# Patient Record
Sex: Female | Born: 1985 | Race: White | Hispanic: No | Marital: Married | Smoking: Former smoker
Health system: Southern US, Community
[De-identification: ages and names within clinical notes are randomized; demographics above are authoritative.]

## PROBLEM LIST (undated history)

## (undated) DIAGNOSIS — E663 Overweight: Secondary | ICD-10-CM

## (undated) DIAGNOSIS — F419 Anxiety disorder, unspecified: Secondary | ICD-10-CM

## (undated) HISTORY — PX: TONSILLECTOMY: SUR1361

## (undated) HISTORY — DX: Overweight: E66.3

## (undated) HISTORY — DX: Anxiety disorder, unspecified: F41.9

---

## 2006-08-20 ENCOUNTER — Emergency Department: Payer: Self-pay | Admitting: General Practice
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
## 2007-07-15 ENCOUNTER — Ambulatory Visit: Payer: Self-pay

## 2008-09-08 ENCOUNTER — Ambulatory Visit: Payer: Self-pay

## 2011-02-28 ENCOUNTER — Ambulatory Visit: Payer: Self-pay | Admitting: Family Medicine

## 2012-05-11 ENCOUNTER — Ambulatory Visit: Payer: Self-pay | Admitting: Internal Medicine

## 2012-05-11 ENCOUNTER — Ambulatory Visit: Payer: Self-pay | Admitting: Physician Assistant

## 2012-05-11 LAB — CBC WITH DIFFERENTIAL/PLATELET
Basophil %: 0.2 %
Eosinophil %: 0.2 %
HCT: 41.1 % (ref 35.0–47.0)
Lymphocyte #: 1 10*3/uL (ref 1.0–3.6)
Lymphocyte %: 9.9 %
MCH: 31.3 pg (ref 26.0–34.0)
MCV: 94 fL (ref 80–100)
Monocyte #: 0.5 x10 3/mm (ref 0.2–0.9)
Neutrophil #: 8.6 10*3/uL — ABNORMAL HIGH (ref 1.4–6.5)
Platelet: 202 10*3/uL (ref 150–440)
RBC: 4.39 10*6/uL (ref 3.80–5.20)
RDW: 13.1 % (ref 11.5–14.5)

## 2012-05-11 LAB — URINALYSIS, COMPLETE
Blood: NEGATIVE
Glucose,UR: NEGATIVE mg/dL (ref 0–75)
Ph: 8 (ref 4.5–8.0)

## 2012-05-11 LAB — COMPREHENSIVE METABOLIC PANEL
Albumin: 4.3 g/dL (ref 3.4–5.0)
Anion Gap: 10 (ref 7–16)
BUN: 8 mg/dL (ref 7–18)
Bilirubin,Total: 0.3 mg/dL (ref 0.2–1.0)
Glucose: 88 mg/dL (ref 65–99)
SGPT (ALT): 21 U/L
Sodium: 141 mmol/L (ref 136–145)
Total Protein: 7.3 g/dL (ref 6.4–8.2)

## 2012-05-11 LAB — PREGNANCY, URINE: Pregnancy Test, Urine: NEGATIVE m[IU]/mL

## 2013-05-12 DIAGNOSIS — O42919 Preterm premature rupture of membranes, unspecified as to length of time between rupture and onset of labor, unspecified trimester: Secondary | ICD-10-CM

## 2013-05-12 DIAGNOSIS — O329XX0 Maternal care for malpresentation of fetus, unspecified, not applicable or unspecified: Secondary | ICD-10-CM

## 2014-01-05 ENCOUNTER — Ambulatory Visit: Payer: Self-pay | Admitting: Physician Assistant

## 2014-03-02 DIAGNOSIS — L309 Dermatitis, unspecified: Secondary | ICD-10-CM | POA: Insufficient documentation

## 2014-03-02 DIAGNOSIS — J309 Allergic rhinitis, unspecified: Secondary | ICD-10-CM | POA: Insufficient documentation

## 2014-12-28 ENCOUNTER — Ambulatory Visit: Payer: Self-pay | Admitting: Physician Assistant

## 2015-07-19 ENCOUNTER — Other Ambulatory Visit: Payer: Self-pay

## 2015-07-19 DIAGNOSIS — Z01419 Encounter for gynecological examination (general) (routine) without abnormal findings: Secondary | ICD-10-CM

## 2015-07-19 NOTE — Progress Notes (Signed)
Labs that were never drawn from annual exam 02/09/2015.

## 2015-09-15 ENCOUNTER — Encounter: Payer: Self-pay | Admitting: Obstetrics and Gynecology

## 2015-09-15 ENCOUNTER — Ambulatory Visit (INDEPENDENT_AMBULATORY_CARE_PROVIDER_SITE_OTHER): Payer: BLUE CROSS/BLUE SHIELD | Admitting: Obstetrics and Gynecology

## 2015-09-15 VITALS — BP 113/81 | HR 101 | Resp 14 | Ht 65.0 in | Wt 144.9 lb

## 2015-09-15 DIAGNOSIS — O34219 Maternal care for unspecified type scar from previous cesarean delivery: Secondary | ICD-10-CM

## 2015-09-15 DIAGNOSIS — O09211 Supervision of pregnancy with history of pre-term labor, first trimester: Secondary | ICD-10-CM | POA: Diagnosis not present

## 2015-09-15 DIAGNOSIS — N926 Irregular menstruation, unspecified: Secondary | ICD-10-CM | POA: Diagnosis not present

## 2015-09-15 DIAGNOSIS — O09219 Supervision of pregnancy with history of pre-term labor, unspecified trimester: Secondary | ICD-10-CM | POA: Insufficient documentation

## 2015-09-15 DIAGNOSIS — Z3200 Encounter for pregnancy test, result unknown: Secondary | ICD-10-CM

## 2015-09-15 LAB — POCT URINE PREGNANCY: Preg Test, Ur: POSITIVE — AB

## 2015-09-15 MED ORDER — DOXYLAMINE-PYRIDOXINE 10-10 MG PO TBEC: 10.0000 mg | DELAYED_RELEASE_TABLET | Freq: Every day | ORAL | Status: DC

## 2015-09-15 NOTE — Patient Instructions (Signed)

## 2015-09-15 NOTE — Progress Notes (Signed)
GYNECOLOGY CLINIC PROGRESS NOTE  Subjective:    Natalie Bennett is a 29 y.o. [id number] female who presents for evaluation of amenorrhea. She believes she could be pregnant. Pregnancy is desired. Sexual Activity: single partner, contraception: none. Current symptoms also include: morning sickness and positive home pregnancy test. Last period was normal.   Patient's last menstrual period was 08/06/2015.    History reviewed. No pertinent past medical history.  Past Surgical History  Procedure Laterality Date  . Cesarean section  2014    T-incision for tranvserse presentation at 34 weeks (PPROM)    Family History  Problem Relation Age of Onset  . Cancer Mother     osteo tumor  . Diabetes Father    Social History   Social History  . Marital Status: Married    Spouse Name: N/A  . Number of Children: N/A  . Years of Education: N/A   Occupational History  . Not on file.   Social History Main Topics  . Smoking status: Never Smoker   . Smokeless tobacco: Not on file  . Alcohol Use: Not on file  . Drug Use: Not on file  . Sexual Activity: Yes   Other Topics Concern  . Not on file   Social History Narrative  . No narrative on file    No current outpatient prescriptions on file prior to visit.   No current facility-administered medications on file prior to visit.    No Known Allergies   Review of Systems A comprehensive review of systems was negative except for: Gastrointestinal: positive for nausea     Objective:    BP 113/81 mmHg  Pulse 101  Resp 14  Ht 5\' 5"  (1.651 m)  Wt 144 lb 14.4 oz (65.726 kg)  BMI 24.11 kg/m2  LMP 08/06/2015 General: alert, no distress and no acute distress    Lab Review Urine HCG: positive    Assessment:    Absence of menstruation.   Nausea   Plan:   - Pregnancy Test: Positive: EDC: 05/13/2015, EGA at 5.5 weeks today by LMP. Briefly discussed pre-natal care options. Pregnancy, Childbirth and the Newborn book given.  Encouraged well-balanced diet, plenty of rest when needed, pre-natal vitamins daily and walking for exercise. Discussed self-help for nausea, avoiding OTC medications until consulting provider or pharmacist, other than Tylenol as needed, minimal caffeine (1-2 cups daily) and avoiding alcohol. She will schedule her initial OB visit in the next month. Feel free to call with any questions. - Patient reports that she is high risk due to h/o [facility] for PPROM with transverse presentation at 34 weeks). Understands that she will not be able to labor and will need to have repeat C-section. Also discussed that due to preterm labor (PPROM), would recommend 17-OHP injections beginning at [redacted] weeks gestation to prevent future preterm deliveries.  Patient notes understanding. - RTC in 1-2 weeks for viability scan and NOB intake.  - Will prescribe Diclegis for nausea of pregnancy.   A total of 15 minutes were spent face-to-face with the patient during this encounter and over half of that time dealt with counseling and coordination of care.  Hildred LaserAnika Jareli Highland, MD [facility]

## 2015-09-28 ENCOUNTER — Ambulatory Visit: Payer: BLUE CROSS/BLUE SHIELD

## 2015-09-28 ENCOUNTER — Ambulatory Visit (INDEPENDENT_AMBULATORY_CARE_PROVIDER_SITE_OTHER): Payer: BLUE CROSS/BLUE SHIELD | Admitting: Obstetrics and Gynecology

## 2015-09-28 VITALS — BP 111/73 | HR 94 | Wt 144.1 lb

## 2015-09-28 DIAGNOSIS — O30049 Twin pregnancy, dichorionic/diamniotic, unspecified trimester: Secondary | ICD-10-CM

## 2015-09-28 DIAGNOSIS — N926 Irregular menstruation, unspecified: Secondary | ICD-10-CM

## 2015-09-28 DIAGNOSIS — Z113 Encounter for screening for infections with a predominantly sexual mode of transmission: Secondary | ICD-10-CM

## 2015-09-28 NOTE — Progress Notes (Signed)
Cindi CarbonJennifer K Yetman presents for NOB nurse interview visit. G-2.  P-1001. Ultrasound done today indicates Mercie EonDi Di Twins. Baby A: 7.2 wks., Baby B: 7 wks.  EDD 05/12/2016. Pt also states she had Hep C booster in August-2016. Dr. Valentino Saxonherry aware and states this should not be a problem.  Pregnancy eduction material pt already had. No cats in the home. NOB labs ordered.  HIV labs and Drug screen were explained optional and she could opt out of tests but did not decline. Drug screen ordered and sent to lab. PNV encouraged. Declines genetic screening. Pt. To follow up with provider in 4 weeks for NOB physical.  To contact office if any concerns. All questions answered.  ZIKA EXPOSURE SCREEN:  The patient has not traveled to a [place] endemic area within the past 6 months, nor has she had unprotected sex with a partner who has travelled to a [place] endemic region within the past 6 months. The patient has been advised to notify us if these factors change any time during this current pregnancy, so adequate testing and monitoring can be initiated.

## 2015-09-28 NOTE — Patient Instructions (Signed)
Pregnancy and Zika Virus Disease Zika virus disease, or Zika, is an illness that can spread to people from mosquitoes that carry the virus. It may also spread from person to person through infected body fluids. Zika first occurred in [place], but recently it has spread to new areas. The virus occurs in tropical climates. The location of Zika continues to change. Most people who become infected with Zika virus do not develop serious illness. However, Zika may cause birth defects in an unborn baby whose mother is infected with the virus. It may also increase the risk of miscarriage. WHAT ARE THE SYMPTOMS OF ZIKA VIRUS DISEASE? In many cases, people who have been infected with Zika virus do not develop any symptoms. If symptoms appear, they usually start about a week after the person is infected. Symptoms are usually mild. They may include:  Fever.  Rash.  Red eyes.  Joint pain. HOW DOES ZIKA VIRUS DISEASE SPREAD? The main way that Zika virus spreads is through the bite of a certain type of mosquito. Unlike most types of mosquitos, which bite only at night, the type of mosquito that carries Zika virus bites both at night and during the day. Zika virus can also spread through sexual contact, through a blood transfusion, and from a mother to her baby before or during birth. Once you have had Zika virus disease, it is unlikely that you will get it again. CAN I PASS ZIKA TO MY BABY DURING PREGNANCY? Yes, Zika can pass from a mother to her baby before or during birth. WHAT PROBLEMS CAN ZIKA CAUSE FOR MY BABY? A woman who is infected with Zika virus while pregnant is at risk of having her baby born with a condition in which the brain or head is smaller than expected (microcephaly). Babies who have microcephaly can have developmental delays, seizures, hearing problems, and vision problems. Having Zika virus disease during pregnancy can also increase the risk of miscarriage. HOW CAN ZIKA VIRUS DISEASE BE  PREVENTED? There is no vaccine to prevent Zika. The best way to prevent the disease is to avoid infected mosquitoes and avoid exposure to body fluids that can spread the virus. Avoid any possible exposure to Zika by taking the following precautions. For women and their sex partners:  Avoid traveling to high-risk areas. The locations where Zika is being reported change often. To identify high-risk areas, check the CDC travel website: www.cdc.gov/zika/geo/index.html  If you or your sex partner must travel to a high-risk area, talk with a health care provider before and after traveling.  Take all precautions to avoid mosquito bites if you live in, or travel to, any of the high-risk areas. Insect repellents are safe to use during pregnancy.  Ask your health care provider when it is safe to have sexual contact. For women:  If you are pregnant or trying to become pregnant, avoid sexual contact with persons who may have been exposed to Zika virus, persons who have possible symptoms of Zika, or persons whose history you are unsure about. If you choose to have sexual contact with someone who may have been exposed to Zika virus, use condoms correctly during the entire duration of sexual activity, every time. Do not share sexual devices, as you may be exposed to body fluids.  Ask your health care provider about when it is safe to attempt pregnancy after a possible exposure to Zika virus. WHAT STEPS SHOULD I TAKE TO AVOID MOSQUITO BITES? Take these steps to avoid mosquito bites when you are   in a high-risk area:  Wear loose clothing that covers your arms and legs.  Limit your outdoor activities.  Do not open windows unless they have window screens.  Sleep under mosquito nets.  Use insect repellent. The best insect repellents have:  DEET, picaridin, oil of lemon eucalyptus (OLE), or IR3535 in them.  Higher amounts of an active ingredient in them.  Remember that insect repellents are safe to use  during pregnancy.  Do not use OLE on children who are younger than 3 years of age. Do not use insect repellent on babies who are younger than 2 months of age.  Cover your child's stroller with mosquito netting. Make sure the netting fits snugly and that any loose netting does not cover your child's mouth or nose. Do not use a blanket as a mosquito-protection cover.  Do not apply insect repellent underneath clothing.  If you are using sunscreen, apply the sunscreen before applying the insect repellent.  Treat clothing with permethrin. Do not apply permethrin directly to your skin. Follow label directions for safe use.  Get rid of standing water, where mosquitoes may reproduce. Standing water is often found in items such as buckets, bowls, animal food dishes, and flowerpots. When you return from traveling to any high-risk area, continue taking actions to protect yourself against mosquito bites for 3 weeks, even if you show no signs of illness. This will prevent spreading Zika virus to uninfected mosquitoes. WHAT SHOULD I KNOW ABOUT THE SEXUAL TRANSMISSION OF ZIKA? People can spread Zika to their sexual partners during vaginal, anal, or oral sex, or by sharing sexual devices. Many people with Zika do not develop symptoms, so a person could spread the disease without knowing that they are infected. The greatest risk is to women who are pregnant or who may become pregnant. Zika virus can live longer in semen than it can live in blood. Couples can prevent sexual transmission of the virus by:  Using condoms correctly during the entire duration of sexual activity, every time. This includes vaginal, anal, and oral sex.  Not sharing sexual devices. Sharing increases your risk of being exposed to body fluid from another person.  Avoiding all sexual activity until your health care provider says it is safe. SHOULD I BE TESTED FOR ZIKA VIRUS? A sample of your blood can be tested for Zika virus. A pregnant  woman should be tested if she may have been exposed to the virus or if she has symptoms of Zika. She may also have additional tests done during her pregnancy, such ultrasound testing. Talk with your health care provider about which tests are recommended.   This information is not intended to replace advice given to you by your health care provider. Make sure you discuss any questions you have with your health care provider.   Document Released: 07/27/2015 Document Reviewed: 07/20/2015 Elsevier Interactive Patient Education 2016 [employer]. Minor Illnesses and Medications in Pregnancy  Cold/Flu:  Sudafed for congestion- Robitussin (plain) for cough- Tylenol for discomfort.  Please follow the directions on the label.  Try not to take any more than needed.  OTC Saline nasal spray and air humidifier or cool-mist  Vaporizer to sooth nasal irritation and to loosen congestion.  It is also important to increase intake of non carbonated fluids, especially if you have a fever.  Constipation:  Colace-2 capsules at bedtime; Metamucil- follow directions on label; Senokot- 1 tablet at bedtime.  Any one of these medications can be used.  It is also   very important to increase fluids and fruits along with regular exercise.  If problem persists please call the office.  Diarrhea:  Kaopectate as directed on the label.  Eat a bland diet and increase fluids.  Avoid highly seasoned foods.  Headache:  Tylenol 1 or 2 tablets every 3-4 hours as needed  Indigestion:  Maalox, Mylanta, Tums or Rolaids- as directed on label.  Also try to eat small meals and avoid fatty, greasy or spicy foods.  Nausea with or without Vomiting:  Nausea in pregnancy is caused by increased levels of hormones in the body which influence the digestive system and cause irritation when stomach acids accumulate.  Symptoms usually subside after 1st trimester of pregnancy.  Try the following:  Keep saltines, graham crackers or dry toast by your bed  to eat upon awakening.  Don't let your stomach get empty.  Try to eat 5-6 small meals per day instead of 3 large ones.  Avoid greasy fatty or highly seasoned foods.   Take OTC Unisom 1 tablet at bed time along with OTC Vitamin B6 25-50 mg 3 times per day.    If nausea continues with vomiting and you are unable to keep down food and fluids you may need a prescription medication.  Please notify your provider.   Sore throat:  Chloraseptic spray, throat lozenges and or plain Tylenol.  Vaginal Yeast Infection:  OTC Monistat for 7 days as directed on label.  If symptoms do not resolve within a week notify provider.  If any of the above problems do not subside with recommended treatment please call the office for further assistance.   Do not take Aspirin, Advil, Motrin or Ibuprofen.  * * OTC= Over the counter

## 2015-09-28 NOTE — Progress Notes (Signed)
I have reviewed the record and concur with patient management and plan.  Patient with newly diagnosed di-di twin gestation.   Hildred LaserAnika Manan Olmo, MD [facility]

## 2015-09-29 LAB — PAIN MGT SCRN (14 DRUGS), UR
Amphetamine Screen, Ur: NEGATIVE ng/mL
BARBITURATE SCRN UR: NEGATIVE ng/mL
BENZODIAZEPINE SCREEN, URINE: NEGATIVE ng/mL
BUPRENORPHINE, URINE: NEGATIVE ng/mL
CANNABINOIDS UR QL SCN: NEGATIVE ng/mL
Cocaine(Metab.)Screen, Urine: NEGATIVE ng/mL
Creatinine(Crt), U: 54.1 mg/dL (ref 20.0–300.0)
Fentanyl, Urine: NEGATIVE pg/mL
MEPERIDINE SCREEN, URINE: NEGATIVE ng/mL
METHADONE SCREEN, URINE: NEGATIVE ng/mL
OXYCODONE+OXYMORPHONE UR QL SCN: NEGATIVE ng/mL
Opiate Scrn, Ur: NEGATIVE ng/mL
PCP SCRN UR: NEGATIVE ng/mL
PH UR, DRUG SCRN: 6.4 (ref 4.5–8.9)
Propoxyphene, Screen: NEGATIVE ng/mL
Tramadol Ur Ql Scn: NEGATIVE ng/mL

## 2015-09-29 LAB — ANTIBODY SCREEN: Antibody Screen: NEGATIVE

## 2015-09-29 LAB — CBC WITH DIFFERENTIAL/PLATELET
BASOS: 0 %
Basophils Absolute: 0 10*3/uL (ref 0.0–0.2)
EOS (ABSOLUTE): 0.1 10*3/uL (ref 0.0–0.4)
EOS: 1 %
HEMATOCRIT: 40.7 % (ref 34.0–46.6)
HEMOGLOBIN: 13.5 g/dL (ref 11.1–15.9)
IMMATURE GRANULOCYTES: 0 %
Immature Grans (Abs): 0 10*3/uL (ref 0.0–0.1)
Lymphocytes Absolute: 1.6 10*3/uL (ref 0.7–3.1)
Lymphs: 17 %
MCH: 30.1 pg (ref 26.6–33.0)
MCHC: 33.2 g/dL (ref 31.5–35.7)
MCV: 91 fL (ref 79–97)
MONOCYTES: 7 %
MONOS ABS: 0.6 10*3/uL (ref 0.1–0.9)
NEUTROS PCT: 75 %
Neutrophils Absolute: 7 10*3/uL (ref 1.4–7.0)
Platelets: 248 10*3/uL (ref 150–379)
RBC: 4.48 x10E6/uL (ref 3.77–5.28)
RDW: 12.8 % (ref 12.3–15.4)
WBC: 9.3 10*3/uL (ref 3.4–10.8)

## 2015-09-29 LAB — URINALYSIS, ROUTINE W REFLEX MICROSCOPIC
BILIRUBIN UA: NEGATIVE
Glucose, UA: NEGATIVE
Ketones, UA: NEGATIVE
LEUKOCYTES UA: NEGATIVE
Nitrite, UA: NEGATIVE
PH UA: 7 (ref 5.0–7.5)
Protein, UA: NEGATIVE
RBC UA: NEGATIVE
Specific Gravity, UA: 1.009 (ref 1.005–1.030)
Urobilinogen, Ur: 0.2 mg/dL (ref 0.2–1.0)

## 2015-09-29 LAB — GC/CHLAMYDIA PROBE AMP
CHLAMYDIA, DNA PROBE: NEGATIVE
NEISSERIA GONORRHOEAE BY PCR: NEGATIVE

## 2015-09-29 LAB — VARICELLA ZOSTER ANTIBODY, IGM: Varicella IgM: <0.91 index (ref 0.00–0.90)

## 2015-09-29 LAB — HIV ANTIBODY (ROUTINE TESTING W REFLEX): HIV SCREEN 4TH GENERATION: NONREACTIVE

## 2015-09-29 LAB — HEPATITIS B SURFACE ANTIGEN: Hepatitis B Surface Ag: NEGATIVE

## 2015-09-29 LAB — RH TYPE: RH TYPE: POSITIVE

## 2015-09-29 LAB — ABO

## 2015-09-29 LAB — NICOTINE SCREEN, URINE: COTININE UR QL SCN: NEGATIVE ng/mL

## 2015-09-29 LAB — RUBELLA ANTIBODY, IGM: Rubella IgM: <20 AU/mL (ref 0.0–19.9)

## 2015-09-29 LAB — RPR: RPR Ser Ql: NONREACTIVE

## 2015-09-30 ENCOUNTER — Ambulatory Visit: Payer: Self-pay | Admitting: Obstetrics and Gynecology

## 2015-09-30 ENCOUNTER — Emergency Department
Admission: EM | Admit: 2015-09-30 | Discharge: 2015-10-01 | Disposition: A | Discharge status: Home / Self Care | Payer: BLUE CROSS/BLUE SHIELD | Attending: Emergency Medicine | Admitting: Emergency Medicine

## 2015-09-30 ENCOUNTER — Telehealth: Payer: Self-pay

## 2015-09-30 ENCOUNTER — Encounter: Payer: Self-pay | Admitting: *Deleted

## 2015-09-30 DIAGNOSIS — Z3A08 8 weeks gestation of pregnancy: Secondary | ICD-10-CM | POA: Diagnosis not present

## 2015-09-30 DIAGNOSIS — Z87891 Personal history of nicotine dependence: Secondary | ICD-10-CM | POA: Diagnosis not present

## 2015-09-30 DIAGNOSIS — Z79899 Other long term (current) drug therapy: Secondary | ICD-10-CM | POA: Diagnosis not present

## 2015-09-30 DIAGNOSIS — O200 Threatened abortion: Secondary | ICD-10-CM

## 2015-09-30 DIAGNOSIS — O210 Mild hyperemesis gravidarum: Secondary | ICD-10-CM | POA: Diagnosis not present

## 2015-09-30 LAB — URINALYSIS COMPLETE WITH MICROSCOPIC (ARMC ONLY)
Bilirubin Urine: NEGATIVE
Glucose, UA: NEGATIVE mg/dL
Hgb urine dipstick: NEGATIVE
KETONES UR: NEGATIVE mg/dL
Leukocytes, UA: NEGATIVE
NITRITE: NEGATIVE
PROTEIN: NEGATIVE mg/dL
Specific Gravity, Urine: 1.015 (ref 1.005–1.030)
pH: 7 (ref 5.0–8.0)

## 2015-09-30 LAB — COMPREHENSIVE METABOLIC PANEL
ALBUMIN: 4.3 g/dL (ref 3.5–5.0)
ALT: 12 U/L — ABNORMAL LOW (ref 14–54)
ANION GAP: 6 (ref 5–15)
AST: 15 U/L (ref 15–41)
Alkaline Phosphatase: 45 U/L (ref 38–126)
BUN: 9 mg/dL (ref 6–20)
CALCIUM: 9.1 mg/dL (ref 8.9–10.3)
CO2: 26 mmol/L (ref 22–32)
CREATININE: 0.45 mg/dL (ref 0.44–1.00)
Chloride: 104 mmol/L (ref 101–111)
GFR calc non Af Amer: >60 mL/min (ref >60)
Glucose, Bld: 107 mg/dL — ABNORMAL HIGH (ref 65–99)
POTASSIUM: 3.9 mmol/L (ref 3.5–5.1)
SODIUM: 136 mmol/L (ref 135–145)
Total Bilirubin: 0.6 mg/dL (ref 0.3–1.2)
Total Protein: 7.6 g/dL (ref 6.5–8.1)

## 2015-09-30 LAB — CBC
HEMATOCRIT: 40 % (ref 35.0–47.0)
Hemoglobin: 13.2 g/dL (ref 12.0–16.0)
MCH: 30 pg (ref 26.0–34.0)
MCHC: 33.1 g/dL (ref 32.0–36.0)
MCV: 90.9 fL (ref 80.0–100.0)
Platelets: 226 10*3/uL (ref 150–440)
RBC: 4.4 MIL/uL (ref 3.80–5.20)
RDW: 12.6 % (ref 11.5–14.5)
WBC: 13.3 10*3/uL — ABNORMAL HIGH (ref 3.6–11.0)

## 2015-09-30 LAB — CULTURE, OB URINE

## 2015-09-30 LAB — LIPASE, BLOOD: Lipase: 33 U/L (ref 11–51)

## 2015-09-30 LAB — URINE CULTURE, OB REFLEX

## 2015-09-30 MED ORDER — SODIUM CHLORIDE 0.9 % IV BOLUS (SEPSIS)
1000.0000 mL | INTRAVENOUS | Status: AC
  Administered 2015-09-30: 1000 mL via INTRAVENOUS

## 2015-09-30 MED ORDER — PROMETHAZINE HCL 25 MG PO TABS: 25.0000 mg | ORAL_TABLET | Freq: Four times a day (QID) | ORAL | Status: DC | PRN

## 2015-09-30 MED ORDER — ONDANSETRON HCL 4 MG/2ML IJ SOLN
4.0000 mg | INTRAMUSCULAR | Status: AC
  Administered 2015-09-30: 4 mg via INTRAVENOUS
  Filled 2015-09-30: qty 2

## 2015-09-30 NOTE — ED Notes (Signed)
N/V x 2 days.  [redacted] weeks pregnant with twins.

## 2015-09-30 NOTE — ED Provider Notes (Addendum)
[facility] Emergency Department Provider Note  ____________________________________________  Time seen: Approximately 1110 PM  I have reviewed the triage vital signs and the nursing notes.   HISTORY  Chief Complaint Emesis During Pregnancy    HPI Natalie Bennett is a 29 y.o. female who is a G2 P1 who is pregnant with twin gestation at 8 weeks who is presenting today with nausea and vomiting. She says that she was just seen by her OB/GYN this past Wednesday and was given a clean bill of health. However earlier today she began vomiting and says she cannot stop. She says she cannot keep anything down. Denies any bloody or bilious vomitus. Says she is taking dye diclegis without any resolution of her symptoms. She also said that she has small amount of brownish discharge today. Denies any abdominal pain. Has a known twin gestation which was seen at an ultrasound this past Wednesday at her OB/GYN appointment with [facility]. She was offered fluids as well as Zofran from triage and is now feeling better with resolution of her vomiting.   History reviewed. No pertinent past medical history.  Patient Active Problem List   Diagnosis Date Noted  . H/O cesarean section complicating pregnancy 09/15/2015  . High risk pregnancy due to history of preterm labor 09/15/2015  . Allergic rhinitis 03/02/2014  . 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 Cesarean section  2014    T-incision for tranvserse presentation at 34 weeks (PPROM)    Current Outpatient Rx  Name  Route  Sig  Dispense  Refill  . Doxylamine-Pyridoxine 10-10 MG TBEC   Oral   Take 10 mg by mouth daily. Day 1-2 take one at bedtime Day 3- may add one mid morning Day 4- may add one in the afternoon. No more than 4 daily.   60 tablet   1   . PRENATAL 27-1 MG TABS            10   . promethazine (PHENERGAN) 25 MG tablet   Oral   Take 1  tablet (25 mg total) by mouth every 6 (six) hours as needed for nausea or vomiting.   30 tablet   1     Allergies Review of patient's allergies indicates no known allergies.  Family History  Problem Relation Age of Onset  . Cancer Mother     osteo tumor  . Diabetes Father     Social History Social History  Substance Use Topics  . Smoking status: Former Games developer  . Smokeless tobacco: Never Used  . Alcohol Use: No    Review of Systems Constitutional: No fever/chills Eyes: No visual changes. ENT: No sore throat. Cardiovascular: Denies chest pain. Respiratory: Denies shortness of breath. Gastrointestinal: No abdominal pain.   No diarrhea.  No constipation. Genitourinary: Negative for dysuria. Musculoskeletal: Negative for back pain. Skin: Negative for rash. Neurological: Negative for headaches, focal weakness or numbness.  10-point ROS otherwise negative.  ____________________________________________   PHYSICAL EXAM:  VITAL SIGNS: ED Triage Vitals  Enc Vitals Group     BP 09/30/15 2134 133/96 mmHg     Pulse Rate 09/30/15 2134 105     Resp 09/30/15 2134 18     Temp 09/30/15 2134 98.3 F (36.8 C)     Temp Source 09/30/15 2134 Oral     SpO2 09/30/15 2134 98 %     Weight 09/30/15 2134 145 lb (65.772 kg)  Height 09/30/15 2134 5\' 5"  (1.651 m)     Head Cir --      Peak Flow --      Pain Score 09/30/15 2213 3     Pain Loc --      Pain Edu? --      Excl. in GC? --     Constitutional: Alert and oriented. Well appearing and in no acute distress. Eyes: Conjunctivae are normal. PERRL. EOMI. Head: Atraumatic. Nose: No congestion/rhinnorhea. Mouth/Throat: Mucous membranes are moist.   Neck: No stridor.   Cardiovascular: Normal rate, regular rhythm. Grossly normal heart sounds.  Good peripheral circulation. Respiratory: Normal respiratory effort.  No retractions. Lungs CTAB. Gastrointestinal: Soft and nontender. No distention. No abdominal bruits. No CVA  tenderness. Genitourinary:  Normal external exam.  Speculum exam without any bleeding or discharge. Bimanual exam with a closed cervical os. There is no CMT. No uterine or adnexal tenderness to palpation. Musculoskeletal: No lower extremity tenderness nor edema.  No joint effusions. Neurologic:  Normal speech and language. No gross focal neurologic deficits are appreciated. No gait instability. Skin:  Skin is warm, dry and intact. No rash noted. Psychiatric: Mood and affect are normal. Speech and behavior are normal.  ____________________________________________   LABS (all labs ordered are listed, but only abnormal results are displayed)  Labs Reviewed  COMPREHENSIVE METABOLIC PANEL - Abnormal; Notable for the following:    Glucose, Bld 107 (*)    ALT 12 (*)    All other components within normal limits  CBC - Abnormal; Notable for the following:    WBC 13.3 (*)    All other components within normal limits  URINALYSIS COMPLETEWITH MICROSCOPIC ([facility] ONLY) - Abnormal; Notable for the following:    Color, Urine YELLOW (*)    APPearance CLOUDY (*)    Bacteria, UA RARE (*)    Squamous Epithelial / LPF 0-5 (*)    All other components within normal limits  WET PREP, GENITAL  CHLAMYDIA/NGC RT PCR ([facility] ONLY)  LIPASE, BLOOD  ABO/RH   ____________________________________________  EKG   ____________________________________________  RADIOLOGY   ____________________________________________   PROCEDURES   ____________________________________________   INITIAL IMPRESSION / ASSESSMENT AND PLAN / ED COURSE  Pertinent labs & imaging results that were available during my care of the patient were reviewed by me and considered in my medical decision making (see chart for details).  ----------------------------------------- 1:14 AM on 10/01/2015 -----------------------------------------  Patient resting complete with this time and tolerating by mouth. We'll discharge  to home. She says that her OB/GYN has a recalled and Phenergan for her to her pharmacy. Reassuring wet prep. Explained to the patient the diagnosis of threatened miscarriage. Also discussed with her pelvic rest until cleared to resume by her primary care doctor. She understands this means no sex or anything placed in the vagina.  ____________________________________________   FINAL CLINICAL IMPRESSION(S) / ED DIAGNOSES  Hyperemesis gravidarum. Threatened miscarriage.    Myrna Blazeravid Matthew Orazio Weller, MD 10/01/15 [phone]  Evaluation of the fetus is at this time with ultrasound or Doppler were not performed because the patient is known to have an intrauterine twin gestation. It is also too very early and the fetuses would not be viable this time so a Doppler was not performed.  Myrna Blazeravid Matthew Markus Casten, MD 10/01/15 [phone]

## 2015-09-30 NOTE — Telephone Encounter (Signed)
Pt calls with spotting in panties and when wipes. Not sexually active. Had TVUS on 09/28/15. Pt advised this was not abnormal especially after sexual intercourse or after her ultrasound. If this worsens to notify office, after hours triage nurse or go to ER. Has mild cramping, not changed. Pt also states she has felt bad last couple days due to n/v. Unable to keep food or liquids done. I mentioned that she may be dehydrated and pt states she could get IV fluids at work if needed. Had not checked urine for ketones. I did suggest some phenergan either po or rectally and pt wants po. To go home and rest, take phenergan as prescribed, try drinking gatorade, ginger, coke or anything they she may be able to keep down. If able to keep liquids down gradually add saltines, dry toast etc. If unable to keep liquids down in 24hrs that she will probably need IV fluids and to either contact after hours triage nurse or go to ER. Dr. Valentino Saxonherry aware.

## 2015-09-30 NOTE — ED Notes (Addendum)
Warm blanket given to patient, per patient request. NS infusing, no further needs at this time.

## 2015-10-01 LAB — ABO/RH: ABO/RH(D): A POS

## 2015-10-01 LAB — CHLAMYDIA/NGC RT PCR (ARMC ONLY)
Chlamydia Tr: NOT DETECTED
N gonorrhoeae: NOT DETECTED

## 2015-10-01 LAB — WET PREP, GENITAL
CLUE CELLS WET PREP: NONE SEEN
TRICH WET PREP: NONE SEEN
YEAST WET PREP: NONE SEEN

## 2015-10-01 NOTE — Discharge Instructions (Signed)
Hyperemesis Gravidarum  Hyperemesis gravidarum is a severe form of nausea and vomiting that happens during pregnancy. Hyperemesis is worse than morning sickness. It may cause you to have nausea or vomiting all day for many days. It may keep you from eating and drinking enough food and liquids. Hyperemesis usually occurs during the first half (the first 20 weeks) of pregnancy. It often goes away once a woman is in her second half of pregnancy. However, sometimes hyperemesis continues through an entire pregnancy.   CAUSES   The cause of this condition is not completely known but is thought to be related to changes in the body's hormones when pregnant. It could be from the high level of the pregnancy hormone or an increase in estrogen in the body.   SIGNS AND SYMPTOMS    Severe nausea and vomiting.   Nausea that does not go away.   Vomiting that does not allow you to keep any food down.   Weight loss and body fluid loss (dehydration).   Having no desire to eat or not liking food you have previously enjoyed.  DIAGNOSIS   Your health care provider will do a physical exam and ask you about your symptoms. He or she may also order blood tests and urine tests to make sure something else is not causing the problem.   TREATMENT   You may only need medicine to control the problem. If medicines do not control the nausea and vomiting, you will be treated in the hospital to prevent dehydration, increased acid in the blood (acidosis), weight loss, and changes in the electrolytes in your body that may harm the unborn baby (fetus). You may need IV fluids.   HOME CARE INSTRUCTIONS    Only take over-the-counter or prescription medicines as directed by your health care provider.   Try eating a couple of dry crackers or toast in the morning before getting out of bed.   Avoid foods and smells that upset your stomach.   Avoid fatty and spicy foods.   Eat 5-6 small meals a day.   Do not drink when eating meals. Drink between  meals.   For snacks, eat high-protein foods, such as cheese.   Eat or suck on things that have ginger in them. Ginger helps nausea.   Avoid food preparation. The smell of food can spoil your appetite.   Avoid iron pills and iron in your multivitamins until after 3-4 months of being pregnant. However, consult with your health care provider before stopping any prescribed iron pills.  SEEK MEDICAL CARE IF:    Your abdominal pain increases.   You have a severe headache.   You have vision problems.   You are losing weight.  SEEK IMMEDIATE MEDICAL CARE IF:    You are unable to keep fluids down.   You vomit blood.   You have constant nausea and vomiting.   You have excessive weakness.   You have extreme thirst.   You have dizziness or fainting.   You have a fever or persistent symptoms for more than 2-3 days.   You have a fever and your symptoms suddenly get worse.  MAKE SURE YOU:    Understand these instructions.   Will watch your condition.   Will get help right away if you are not doing well or get worse.     This information is not intended to replace advice given to you by your health care provider. Make sure you discuss any questions you have with   your health care provider.     Document Released: 11/05/2005 Document Revised: 08/26/2013 Document Reviewed: 06/17/2013  Elsevier Interactive Patient Education 2016 [employer]vier Inc.

## 2015-10-01 NOTE — ED Notes (Signed)
Patient with no complaints at this time. Respirations even and unlabored. Skin warm/dry. Discharge instructions reviewed with patient at this time. Patient given opportunity to voice concerns/ask questions. IV removed per policy and band-aid applied to site. Patient discharged at this time and left Emergency Department with steady gait.  

## 2015-10-01 NOTE — ED Notes (Signed)
Apple juice given to pt. Pt denies nausea/vomiting sx. NAD noted. No further needs at this time.

## 2015-10-03 ENCOUNTER — Telehealth: Payer: Self-pay | Admitting: Obstetrics and Gynecology

## 2015-10-03 NOTE — Telephone Encounter (Signed)
Pt calls stating she has been sick since she left hosptial, unable to keep anything down. Placed on hold to schedule appt and got disconnected. Tried to call pt back and left a message. Was informed to go back to ER today and make an appt to be seen tomorrow. Pt states she could get IV fluid at her work instead of going back to ER.

## 2015-10-03 NOTE — Telephone Encounter (Signed)
Pt getting IV fluids through her work. Would like an appt for Wed, or Thurs. Appt made for Wed. On 10/05/2015 at 11:45am.

## 2015-10-03 NOTE — Telephone Encounter (Signed)
Patient is 8 weeks 2 days pregnant with twins, she was seen in the ER on Friday and admitted for dehydration and given fluids and zofran by IV. Patient states she is still vomiting and cant keep anything down. Please Advise

## 2015-10-04 ENCOUNTER — Telehealth: Payer: Self-pay

## 2015-10-04 NOTE — Telephone Encounter (Signed)
Left pt message to contact office. (Dr. Valentino Saxonherry advised that pt may try Zofran 8 mg po rountinely, start today and if helps, does not need to come in tomorrow).

## 2015-10-04 NOTE — Telephone Encounter (Signed)
Pt informed of Dr. Oretha Milchherry's suggestion of Zofran but does not wish to take this medication. Is doing better today. Taking her phenergan. If pt continues to feel better may cancel tomorrows appt, otherwise will keep appt.

## 2015-10-05 ENCOUNTER — Encounter: Payer: Self-pay | Admitting: Obstetrics and Gynecology

## 2015-10-06 ENCOUNTER — Telehealth: Payer: Self-pay | Admitting: Obstetrics and Gynecology

## 2015-10-06 NOTE — Telephone Encounter (Signed)
Pt said yiou talked to her about a new medication (Zofran) she wants to see if Dr. Valentino Saxonherry can sent to the pharmacy  ([facility] [place])

## 2015-10-07 MED ORDER — ONDANSETRON HCL 8 MG PO TABS: ORAL_TABLET | ORAL | Status: DC

## 2015-10-07 NOTE — Telephone Encounter (Signed)
Left message that prescription sent to pharmacy for Zofran 8mg . Po every 8 hours routinely for nausea/vomiting.

## 2015-10-11 DIAGNOSIS — O30049 Twin pregnancy, dichorionic/diamniotic, unspecified trimester: Secondary | ICD-10-CM | POA: Insufficient documentation

## 2015-10-26 ENCOUNTER — Ambulatory Visit (INDEPENDENT_AMBULATORY_CARE_PROVIDER_SITE_OTHER): Payer: BLUE CROSS/BLUE SHIELD

## 2015-10-26 ENCOUNTER — Ambulatory Visit (INDEPENDENT_AMBULATORY_CARE_PROVIDER_SITE_OTHER): Payer: BLUE CROSS/BLUE SHIELD | Admitting: Obstetrics and Gynecology

## 2015-10-26 VITALS — BP 115/67 | HR 87 | Wt 143.7 lb

## 2015-10-26 DIAGNOSIS — Z3481 Encounter for supervision of other normal pregnancy, first trimester: Secondary | ICD-10-CM

## 2015-10-26 DIAGNOSIS — O30049 Twin pregnancy, dichorionic/diamniotic, unspecified trimester: Secondary | ICD-10-CM

## 2015-10-26 DIAGNOSIS — Z3401 Encounter for supervision of normal first pregnancy, first trimester: Secondary | ICD-10-CM

## 2015-10-26 DIAGNOSIS — O34219 Maternal care for unspecified type scar from previous cesarean delivery: Secondary | ICD-10-CM

## 2015-10-26 DIAGNOSIS — O09211 Supervision of pregnancy with history of pre-term labor, first trimester: Secondary | ICD-10-CM

## 2015-10-26 DIAGNOSIS — O210 Mild hyperemesis gravidarum: Secondary | ICD-10-CM | POA: Insufficient documentation

## 2015-10-26 DIAGNOSIS — Z124 Encounter for screening for malignant neoplasm of cervix: Secondary | ICD-10-CM

## 2015-10-26 DIAGNOSIS — Z3491 Encounter for supervision of normal pregnancy, unspecified, first trimester: Secondary | ICD-10-CM

## 2015-10-26 LAB — POCT URINALYSIS DIPSTICK
BILIRUBIN UA: NEGATIVE
GLUCOSE UA: NEGATIVE
Ketones, UA: NEGATIVE
Leukocytes, UA: NEGATIVE
Nitrite, UA: NEGATIVE
Protein, UA: NEGATIVE
RBC UA: NEGATIVE
SPEC GRAV UA: 1.015
UROBILINOGEN UA: NEGATIVE
pH, UA: 6.5

## 2015-10-26 NOTE — Progress Notes (Signed)
OB INITIAL PRENATAL VISIT PROGRESS NOTE  Subjective:    Natalie Bennett is being seen today for her first obstetrical visit.  This is a planned pregnancy. She is at [redacted]w[redacted]d with a twin di-di gestation. Her obstetrical history is significant for hyperemesis, PPROM and preterm delivery in previous pregnancy with T-incision. Relationship with FOB: spouse, living together. Patient does intend to breast feed. Pregnancy history fully reviewed.  Menstrual History: Obstetric History   G2   P1   T0   P1   A0   TAB0   SAB0   E0   M0   L1     # Outcome Date GA Lbr Len/2nd Weight Sex Delivery Anes PTL Lv  2 Current           1 Preterm 05/12/13 [redacted]w[redacted]d  4 lb 12 oz (2.155 kg)   Spinal Y Y     Complications: Preterm premature rupture of membranes (PPROM) with unknown onset of labor,Fetal malpresentation    Obstetric Comments  T-incision    Menarche age: 44 Patient's last menstrual period was 08/06/2015.  Denies h/o abnormal paps or STIs.  Last pap smear 2013 (at onset of prior pregnancy)   No past medical history on file.   Past Surgical History  Procedure Laterality Date  . Cesarean section  2014    T-incision for tranvserse presentation at 34 weeks (PPROM)    Family History  Problem Relation Age of Onset  . Cancer Mother     osteo tumor  . Diabetes Father     Social History   Social History  . Marital Status: Married    Spouse Name: N/A  . Number of Children: N/A  . Years of Education: N/A   Occupational History  . Not on file.   Social History Main Topics  . Smoking status: Former Games developer  . Smokeless tobacco: Never Used  . Alcohol Use: No  . Drug Use: No  . Sexual Activity: Yes   Other Topics Concern  . Not on file   Social History Narrative    Current Outpatient Prescriptions on File Prior to Visit  Medication Sig Dispense Refill  . ondansetron (ZOFRAN) 8 MG tablet Every 8 hrs (routinely) 90 tablet 0  . PRENATAL 27-1 MG TABS Take 1 tablet by mouth daily.   10   . promethazine (PHENERGAN) 25 MG tablet Take 1 tablet (25 mg total) by mouth every 6 (six) hours as needed for nausea or vomiting. 30 tablet 1  . Doxylamine-Pyridoxine 10-10 MG TBEC Take 10 mg by mouth daily. Day 1-2 take one at bedtime Day 3- may add one mid morning Day 4- may add one in the afternoon. No more than 4 daily. (Patient not taking: Reported on 10/26/2015) 60 tablet 1   No current facility-administered medications on file prior to visit.    No Known Allergies  Review of Systems General:Not Present- Fever, Weight Loss and Weight Gain. Skin:Not Present- Rash. HEENT:Not Present- Blurred Vision, Headache and Bleeding Gums. Respiratory:Not Present- Difficulty Breathing. Breast:Not Present- Breast Mass. Cardiovascular:Not Present- Chest Pain, Elevated Blood Pressure, Fainting / Blacking Out and Shortness of Breath. Gastrointestinal:Present - Nausea and vomiting (improving with meds). Not Present- Abdominal Pain, Constipation. Female Genitourinary:Not Present- Frequency, Painful Urination, Pelvic Pain, Vaginal Bleeding, Vaginal Discharge, Contractions, regular, Fetal Movements Decreased, Urinary Complaints and Vaginal Fluid. Musculoskeletal:Not Present- Back Pain and Leg Cramps. Neurological:Not Present- Dizziness. Psychiatric:Not Present- Depression.    Objective:    BP 115/67 mmHg  Pulse 87  Wt 143  lb 11.2 oz (65.182 kg)  LMP 08/06/2015     General Appearance:    Alert, cooperative, no distress, appears stated age  Head:    Normocephalic, without obvious abnormality, atraumatic  Eyes:    PERRL, conjunctiva/corneas clear, EOM's intact, both eyes  Ears:    Normal external ear canals, both ears  Nose:   Nares normal, septum midline, mucosa normal, no drainage or sinus tenderness  Throat:   Lips, mucosa, and tongue normal; teeth and gums normal  Neck:   Supple, symmetrical, trachea midline, no adenopathy; thyroid: no enlargement/tenderness/nodules; no carotid  bruit or JVD  Back:     Symmetric, no curvature, ROM normal, no CVA tenderness  Lungs:     Clear to auscultation bilaterally, respirations unlabored  Chest Wall:    No tenderness or deformity   Heart:    Regular rate and rhythm, S1 and S2 normal, no murmur, rub or gallop  Breast Exam:    No tenderness, masses, or nipple abnormality  Abdomen:     Soft, non-tender, bowel sounds active all four quadrants, no masses, no organomegaly.  FH 14.  FHT 151 bpm (Twin A, could not identify FHT for Twin B).  Genitalia:    Pelvic:external genitalia normal, vagina with lesions, discharge, or tenderness, rectovaginal septum  normal. Cervix normal in appearance, no cervical motion tenderness, no adnexal masses or tenderness.  Pregnancy positive findings: uterine enlargement: 14 wk size, nontender.   Rectal:    Normal external sphincter.  No hemorrhoids appreciated. Internal exam not done.   Extremities:   Extremities normal, atraumatic, no cyanosis or edema  Pulses:   2+ and symmetric all extremities  Skin:   Skin color, texture, turgor normal, no rashes or lesions  Lymph nodes:   Cervical, supraclavicular, and axillary nodes normal  Neurologic:   CNII-XII intact, normal strength, sensation and reflexes throughout     Limited OB Ultrasound (10/26/2015):  FCA 155 (Twin A), and 148 (Twin B)  Assessment:    Pregnancy at 7811 and 4/7 weeks   Twin di-di gestation Hyperemesis gravidarum H/o PPROM with preterm delivery H/o prior C-section with T-incision   Plan:    Initial labs reviewed.  Pap smear performed today.  Prenatal vitamins encouraged. Problem list reviewed and updated. Continue Zofran (takes during the day) and Phenergan (q pm) for hyperemesis.  New OB counseling:  The patient has been given an overview regarding routine prenatal care.  Recommendations regarding diet, weight gain, and exercise in pregnancy were given. Prenatal testing, optional genetic testing, and ultrasound use in pregnancy  were reviewed.  Will need serial growth scans q 4-6 weeks beginning at 26-[redacted] weeks gestation.  Unsure regarding genetic testing, given handouts on Panorama/first trimester/second trimester screening.  Benefits of Breast Feeding were discussed. The patient is encouraged to consider nursing her baby post partum. Patient with prior h/o PPROM with preterm delivery.  Will discuss with [facility] if 17-OHP is warranted as current pregnancy is with twins (not proven to be beneficial for twin deliveries) and anticipation is for an earlier delivery.  Patient understands that she will need a repeat C-section (cannot VBAC) due to prior uterine incision type.  Will schedule for 36-37 weeks.   Follow up in 4 weeks.   Hildred LaserAnika Oluwafemi Villella, MD [facility]

## 2015-11-03 LAB — PAP IG AND HPV HIGH-RISK
HPV, high-risk: NEGATIVE
PAP SMEAR COMMENT: 0

## 2015-11-04 ENCOUNTER — Other Ambulatory Visit: Payer: Self-pay | Admitting: Obstetrics and Gynecology

## 2015-11-04 DIAGNOSIS — O30002 Twin pregnancy, unspecified number of placenta and unspecified number of amniotic sacs, second trimester: Secondary | ICD-10-CM

## 2015-11-08 ENCOUNTER — Other Ambulatory Visit: Payer: Self-pay | Admitting: Obstetrics and Gynecology

## 2015-11-08 ENCOUNTER — Ambulatory Visit (INDEPENDENT_AMBULATORY_CARE_PROVIDER_SITE_OTHER): Payer: BLUE CROSS/BLUE SHIELD

## 2015-11-08 DIAGNOSIS — O30002 Twin pregnancy, unspecified number of placenta and unspecified number of amniotic sacs, second trimester: Secondary | ICD-10-CM

## 2015-11-10 LAB — FIRST TRIMESTER SCREEN W/NT
CRL: 81.5 mm
CROWN RUMP LENGTH TWIN B: 77.2 mm
DIA MOM: 1.24
DIA Value: 269.1 pg/mL
Gest Age-Collect: 13.4 weeks
HCG VALUE: 82.6 [IU]/mL
Maternal Age At EDD: 31.2 years
NT MOM TWIN B: 0.95
NT TWIN B: 1.8 mm
Nuchal Translucency MoM: 0.8
Nuchal Translucency: 1.6 mm
Number of Fetuses: 2
PAPP-A MoM: 1.32
PAPP-A VALUE: 1966.6 ng/mL
PDF: 0
Weight: 143 [lb_av]
hCG MoM: 1.02

## 2015-11-15 ENCOUNTER — Telehealth: Payer: Self-pay

## 2015-11-15 NOTE — Telephone Encounter (Signed)
LM informing pt of normal genetic screening.

## 2015-11-15 NOTE — Telephone Encounter (Signed)
-----   Message from Hildred LaserAnika Cherry, MD sent at 11/15/2015  9:07 AM EST ----- Please inform of normal genetic screening (1st trimester)

## 2015-11-25 ENCOUNTER — Ambulatory Visit (INDEPENDENT_AMBULATORY_CARE_PROVIDER_SITE_OTHER): Payer: BLUE CROSS/BLUE SHIELD | Admitting: Obstetrics and Gynecology

## 2015-11-25 ENCOUNTER — Telehealth: Payer: Self-pay | Admitting: Obstetrics and Gynecology

## 2015-11-25 ENCOUNTER — Other Ambulatory Visit: Payer: Self-pay | Admitting: Obstetrics and Gynecology

## 2015-11-25 VITALS — BP 115/74 | HR 87 | Wt 146.4 lb

## 2015-11-25 DIAGNOSIS — O210 Mild hyperemesis gravidarum: Secondary | ICD-10-CM

## 2015-11-25 DIAGNOSIS — O09212 Supervision of pregnancy with history of pre-term labor, second trimester: Secondary | ICD-10-CM

## 2015-11-25 DIAGNOSIS — Z1379 Encounter for other screening for genetic and chromosomal anomalies: Secondary | ICD-10-CM

## 2015-11-25 DIAGNOSIS — Z23 Encounter for immunization: Secondary | ICD-10-CM

## 2015-11-25 DIAGNOSIS — O30042 Twin pregnancy, dichorionic/diamniotic, second trimester: Secondary | ICD-10-CM

## 2015-11-25 LAB — POCT URINALYSIS DIPSTICK
BILIRUBIN UA: NEGATIVE
Glucose, UA: NEGATIVE
Ketones, UA: NEGATIVE
Leukocytes, UA: NEGATIVE
NITRITE UA: NEGATIVE
PH UA: 6.5
Protein, UA: NEGATIVE
SPEC GRAV UA: 1.01
Urobilinogen, UA: NEGATIVE

## 2015-11-25 MED ORDER — INFLUENZA VAC SPLIT QUAD 0.5 ML IM SUSY
0.5000 mL | PREFILLED_SYRINGE | Freq: Once | INTRAMUSCULAR | Status: AC
  Administered 2015-11-25: 0.5 mL via INTRAMUSCULAR

## 2015-11-25 MED ORDER — INFLUENZA VAC SPLIT QUAD 0.5 ML IM SUSY: 0.5000 mL | PREFILLED_SYRINGE | INTRAMUSCULAR | Status: DC

## 2015-11-25 NOTE — Telephone Encounter (Signed)
Contacted patient to inform her that I have [facility], who note that there is no consensus as to whether or not to begin 17-progesterone injections in current pregnancy due to twins, despite history of prior preterm birth.  Is left to discretion of patient and primary OB.  Previously had discussed risks vs benefits. Patient to inform MD of decision of whether or not she desires to proceed or not with medication, or if she has further questions regarding this matter.  Hildred LaserAnika Idabelle Mcpeters, MD [facility]

## 2015-11-25 NOTE — Progress Notes (Signed)
ROB: Patient notes that vomiting has been improving.  To discuss with [facility] regarding whether 17-OHP is indicated for the patient in this pregnancy due to h/o prior preterm delivery (as there have been no conclusive studies regarding the effectiveness in twin gestation).  Unable to auscultate Twin B on Dopplers, noted on limited ultrasound.  RTC in 4 weeks, or sooner if indicated. For anaotmy scan in 3-4 weeks. Flu vaccine given today.  MSAFP ordered.

## 2015-11-26 LAB — AFP TUMOR MARKER: AFP-Tumor Marker: 112.6 ng/mL — ABNORMAL HIGH (ref 0.0–8.3)

## 2015-11-28 ENCOUNTER — Telehealth: Payer: Self-pay | Admitting: Obstetrics and Gynecology

## 2015-11-28 ENCOUNTER — Telehealth: Payer: Self-pay

## 2015-11-28 NOTE — Telephone Encounter (Signed)
OK. Thanks.

## 2015-11-28 NOTE — Telephone Encounter (Signed)
This pt wants you to call her back she would specify why. Sorry!

## 2015-11-28 NOTE — Telephone Encounter (Signed)
Lurena JoinerRebecca at [facility] was given correct test number ([id number] msafp only) she will fax over order and let us know if enough sample was left to run test.

## 2015-11-28 NOTE — Telephone Encounter (Signed)
-----   Message from Hildred LaserAnika Cherry, MD sent at 11/28/2015  1:31 PM EST ----- This is the wrong AFP.  I ordered the serum MSAFP.  Not sure what happened. Does she need to have this redrawn or can they run th new test from a previous specimen?

## 2015-12-06 ENCOUNTER — Telehealth: Payer: Self-pay | Admitting: Obstetrics and Gynecology

## 2015-12-06 NOTE — Telephone Encounter (Signed)
Called pt she states that she has a stomach virus and wonders what she can take, advised pt that she may take Kaopectate for diarrhea, and continue Zofran and Phenergan for vomiting. Advised pt to drink Pedialyte and Gatorade for hydration. Advised on signs of dehydration. Pt also c/o abdominal pressure and ankle swelling as she stands on her feet all day. Pt questions if she will be able to make it through the pregnancy in her current position. Advised pt to look into light duty or lessened hours . Pt states she will speak to her supervisor and let me know.

## 2015-12-06 NOTE — Telephone Encounter (Signed)
PT CALLED AND SHE HAD A COUPLE OF QUESTIONS FOR YOU AND WANTED YOU TO CALL HER BACK.

## 2015-12-07 ENCOUNTER — Telehealth: Payer: Self-pay | Admitting: Obstetrics and Gynecology

## 2015-12-07 NOTE — Telephone Encounter (Signed)
Pt was told to call back if she wasn't feeling better. Pregnant with twins 18 wks. She is still feeling sick.

## 2015-12-07 NOTE — Telephone Encounter (Signed)
Called pt she states that she was feeling dehydrated and is still experiencing n/v. Pt states that she was given 2 bags of fluid at her job and is feeling a little better. Advised pt on staying hydrated and the potential need for rectal suppositories for n/v control. Pt to call back if no improvement.

## 2015-12-08 ENCOUNTER — Telehealth: Payer: Self-pay | Admitting: Obstetrics and Gynecology

## 2015-12-08 NOTE — Telephone Encounter (Signed)
Pt states that last week she had burning with urination, lasted one day. Pt states that on Monday she noticed some discharge with odor, pt notes that she also looked at the vagina with a mirror and noticed a small "hard knot". Advised pt that she needed to come in to the office to be evaluated for all of these complaints. Pt states that her work schedule will not permit her to be seen until next week. Pt added to schedule for 12/14/2015 at 8:15am.

## 2015-12-08 NOTE — Telephone Encounter (Signed)
Pt left msg on aswering machine said she has been talking to you the last couple of days and has yet another question for you. She said please call at your earliest convinence and it would be greatly appreciated.

## 2015-12-14 ENCOUNTER — Encounter: Payer: BLUE CROSS/BLUE SHIELD | Admitting: Obstetrics and Gynecology

## 2015-12-20 ENCOUNTER — Telehealth: Payer: Self-pay | Admitting: Obstetrics and Gynecology

## 2015-12-20 NOTE — Telephone Encounter (Signed)
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

## 2015-12-20 NOTE — Telephone Encounter (Signed)
Called pt, no answer. LM for pt letting her know to stay hydrated, rest when needed, elevate feet. Advised pt to call back tomorrow to reevaluate.

## 2015-12-27 ENCOUNTER — Encounter: Payer: BLUE CROSS/BLUE SHIELD | Admitting: Obstetrics and Gynecology

## 2015-12-27 ENCOUNTER — Ambulatory Visit (INDEPENDENT_AMBULATORY_CARE_PROVIDER_SITE_OTHER): Payer: BLUE CROSS/BLUE SHIELD | Admitting: Obstetrics and Gynecology

## 2015-12-27 ENCOUNTER — Ambulatory Visit (INDEPENDENT_AMBULATORY_CARE_PROVIDER_SITE_OTHER): Payer: BLUE CROSS/BLUE SHIELD

## 2015-12-27 ENCOUNTER — Encounter: Payer: Self-pay | Admitting: Obstetrics and Gynecology

## 2015-12-27 VITALS — BP 103/68 | HR 90 | Wt 152.4 lb

## 2015-12-27 DIAGNOSIS — O30042 Twin pregnancy, dichorionic/diamniotic, second trimester: Secondary | ICD-10-CM

## 2015-12-27 DIAGNOSIS — O09212 Supervision of pregnancy with history of pre-term labor, second trimester: Secondary | ICD-10-CM | POA: Diagnosis not present

## 2015-12-27 DIAGNOSIS — O210 Mild hyperemesis gravidarum: Secondary | ICD-10-CM

## 2015-12-27 LAB — POCT URINALYSIS DIPSTICK
BILIRUBIN UA: NEGATIVE
GLUCOSE UA: NEGATIVE
KETONES UA: NEGATIVE
Nitrite, UA: NEGATIVE
Protein, UA: NEGATIVE
SPEC GRAV UA: 1.02
Urobilinogen, UA: NEGATIVE
pH, UA: 6.5

## 2015-12-27 NOTE — Progress Notes (Signed)
ROB: Patient notes difficulties with standing for long periods of time at job, notes episodes of near syncope and nausea.  Desire work notice for light duty/altered duty.  Reports having "stomach bug " ~ 2 weeks ago which worsened her usual nausea/vomiting of pregnancy. Is just now starting to have an appetite.  Patient notes that she now declines use of 17-OHP for current pregnancy, understands risks vs benefits of decision.  S/p normal anatomy scan.  To have serial growth scans q 4-6 weeks for twin pregnancy. MSAFP results pending (missing information in lab request, currently completed).

## 2015-12-29 ENCOUNTER — Telehealth: Payer: Self-pay

## 2015-12-29 LAB — AFP, SERUM, OPEN SPINA BIFIDA
AFP MoM: 2.88
AFP VALUE AFPOSL: 93.1 ng/mL
GEST. AGE ON COLLECTION DATE: 15.9 wk
Maternal Age At EDD: 31.2 years
OSBR Risk 1 IN: 429
PDF: 0
TEST RESULTS AFP: NEGATIVE
Weight: 146 [lb_av]

## 2015-12-29 LAB — SPECIMEN STATUS REPORT

## 2015-12-29 NOTE — Telephone Encounter (Signed)
-----   Message from Hildred Laser, MD sent at 12/27/2015  5:10 PM EST ----- Mentioned this earlier, no results given on MSAFP (was run as AFP tumor marker). Need missing information (although I do believe Crystal called about this a while ago).

## 2015-12-29 NOTE — Telephone Encounter (Signed)
Called pt and informed her of negative AFP as per paper report [facility] provided. See document scanned in chart.

## 2016-01-25 ENCOUNTER — Ambulatory Visit (INDEPENDENT_AMBULATORY_CARE_PROVIDER_SITE_OTHER): Payer: BLUE CROSS/BLUE SHIELD | Admitting: Obstetrics and Gynecology

## 2016-01-25 ENCOUNTER — Encounter: Payer: Self-pay | Admitting: Obstetrics and Gynecology

## 2016-01-25 VITALS — BP 113/71 | HR 92 | Wt 163.5 lb

## 2016-01-25 DIAGNOSIS — O30042 Twin pregnancy, dichorionic/diamniotic, second trimester: Secondary | ICD-10-CM

## 2016-01-25 LAB — POCT URINALYSIS DIPSTICK
BILIRUBIN UA: NEGATIVE
GLUCOSE UA: NEGATIVE
Ketones, UA: NEGATIVE
LEUKOCYTES UA: NEGATIVE
NITRITE UA: NEGATIVE
PH UA: 6
Protein, UA: NEGATIVE
RBC UA: NEGATIVE
SPEC GRAV UA: 1.025
Urobilinogen, UA: NEGATIVE

## 2016-01-25 NOTE — Progress Notes (Signed)
Regular OB appointment: Good fetal movement.  Increased pelvic pressure with increased vaginal discharge without bleeding is noted.  No regular contractions appreciated.  Patient is working full-time and does prolonged standing.  No significant swelling is identified.  Physical exam: Cervix-30%/soft/-3/closed. Plan: Return in 4 weeks for follow-up; Glucola at 28 weeks; anatomy scan in 2 weeks as scheduled.  Preterm labor and preeclampsia precautions addressed.

## 2016-02-03 ENCOUNTER — Other Ambulatory Visit: Payer: Self-pay | Admitting: Obstetrics and Gynecology

## 2016-02-03 DIAGNOSIS — O30049 Twin pregnancy, dichorionic/diamniotic, unspecified trimester: Secondary | ICD-10-CM

## 2016-02-07 ENCOUNTER — Other Ambulatory Visit: Payer: BLUE CROSS/BLUE SHIELD

## 2016-02-10 ENCOUNTER — Telehealth: Payer: Self-pay

## 2016-02-10 NOTE — Telephone Encounter (Signed)
Pt calls and states that she is having lots of pressure and sharp pains. Pt denies bleeding or leaking of fluid. Pt called her new OB office [facility]) however was told by them that since she is not established with them yet they cannot see her (upcoming appt Monday). Advised pt that there is no provider in the office on Friday and that I would recommend her going to L&D if she cannot tolerate pain, otherwise advised pt on the use of tylenol and warm baths for relief.

## 2016-02-14 ENCOUNTER — Encounter: Payer: Self-pay | Admitting: Obstetrics and Gynecology

## 2016-02-20 ENCOUNTER — Telehealth: Payer: Self-pay | Admitting: Obstetrics and Gynecology

## 2016-02-20 NOTE — Telephone Encounter (Signed)
DR Leatha GildingLIVINGSTON CALLED ABOUT DELIVERY OVER THE WEEKEND FOR Illyana, SHE WOULD LIKE FOR YOU TO CALL HER SO SHE CAN TOUCH BASE WITH YOU, THIS PATIENT DID TRANSFER OUT BUT I DON'T THINK IT HAS BEEN 30 DAYS SINCE SHE HAS TRANSFERRED OUT AND IF SHE HAD EVEN ESTABLISHED CARE SOMEWHERE ELSE. DR Leatha GildingLIVINGSTON NUMBER IS [phone]

## 2016-02-23 ENCOUNTER — Encounter: Payer: BLUE CROSS/BLUE SHIELD | Admitting: Obstetrics and Gynecology

## 2016-03-09 ENCOUNTER — Encounter: Payer: Self-pay | Admitting: Family Medicine

## 2016-03-16 ENCOUNTER — Telehealth: Payer: Self-pay | Admitting: Obstetrics and Gynecology

## 2016-03-16 NOTE — Telephone Encounter (Signed)
Contacted patient to discuss concerns and express sympathies regarding misdiagnosis of twin gestation (di-di instead of mono-di).  Twins were diagnosed at 28 weeks with twin-twin transfusion and [facility].  Discussed possible reasons as to misdiagnosis.  Also discussed patient's concern for feeling as though she "felt like a burden" to the provider and "did not feel as though she received adequate care".  Expressed again that patient was never a burden and that ample opportunities have always been present for patient to express any concerns.  Informed patient that I have discussed her case with [facility], as well as several of the sonographers who participated in her ultrasounds.  Also inquired as to why her final ultrasound with [facility] was cancelled (at approximately 24-24 weeksg gestation), and patient notes that she had transitioned her care, but had not yet seen her new providers, and did not think she needed to keep that appointment.  Apologized again for the error made.  Patient thanked me for the phone call.     Hildred LaserAnika Shady Padron, MD [facility]

## 2016-07-27 ENCOUNTER — Encounter: Payer: Self-pay | Admitting: Obstetrics and Gynecology

## 2016-08-02 ENCOUNTER — Encounter (HOSPITAL_COMMUNITY): Payer: Self-pay

## 2018-06-30 ENCOUNTER — Telehealth: Payer: Self-pay | Admitting: Obstetrics and Gynecology

## 2018-06-30 NOTE — Telephone Encounter (Signed)
The patient called today at 10:08 AM requesting all ultrasound records from pregnancy in 2016 thru 2017.  After looking at her chart for her information, I informed the patient that the ultrasound tech has changed and I am not sure if she will have access to her ultrasound records or if those will need to come from the hospital.  She stated, "I don't care how you get them, but I AM entitled to them and I want them!"  I told her that I would send her request to the lab tech, Noreene LarssonJill, and she would be in contact with her.  Her contact number is [phone], please advise, thanks.

## 2018-06-30 NOTE — Telephone Encounter (Signed)
The patient was very upset when she called so I checked into the request of putting her US images on a CD and found out she needs to contact [place] at [place] at [phone].  The patient was contacted and informed to contact HIM for those images/records **NO FURTHER ACTION REQUIRED** at this time, thanks

## 2020-07-20 DIAGNOSIS — U071 COVID-19: Secondary | ICD-10-CM

## 2020-07-20 HISTORY — DX: COVID-19: U07.1

## 2020-08-07 ENCOUNTER — Ambulatory Visit
Admission: EM | Admit: 2020-08-07 | Discharge: 2020-08-07 | Disposition: A | Discharge status: Home / Self Care | Payer: BC Managed Care – PPO | Attending: Emergency Medicine | Admitting: Emergency Medicine

## 2020-08-07 ENCOUNTER — Encounter: Payer: Self-pay | Admitting: Emergency Medicine

## 2020-08-07 ENCOUNTER — Other Ambulatory Visit: Payer: Self-pay

## 2020-08-07 DIAGNOSIS — U071 COVID-19: Secondary | ICD-10-CM

## 2020-08-07 NOTE — ED Provider Notes (Signed)
[facility] [place]   Name: Natalie Bennett DOB: [birth year] MRN: [id number] CSN: [id number] PCP: Patient, No Pcp Per  Arrival date and time:  08/07/20 1240  Chief Complaint:  Covid Exposure, loss of taste, loss of smell, Nasal Congestion, and Cough   NOTE: Prior to seeing the patient today, I have reviewed the triage nursing documentation and vital signs. Clinical staff has updated patient's PMH/PSHx, current medication list, and drug allergies/intolerances to ensure comprehensive history available to assist in medical decision making.   History:   HPI: Natalie Bennett is a 34 y.o. female who presents today with the  complaints below.   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 Previous COVID-19 test result: Positive rapid test completed at home yesterday    Past Medical History:  Diagnosis Date   Anxiety     Past Surgical History:  Procedure Laterality Date   CESAREAN SECTION  2014   T-incision for tranvserse presentation at 80 weeks (PPROM)   TONSILLECTOMY      Family History  Problem Relation Age of Onset   Cancer Mother        osteo tumor   Diabetes Father     Social History   Tobacco Use   Smoking status: Former Smoker    Quit date: 08/07/2008    Years since quitting: 12.0   Smokeless tobacco: Never Used  Vaping Use   Vaping Use: Never used  Substance Use Topics   Alcohol use: Yes    Alcohol/week: 0.0 standard drinks    Comment: social   Drug use: No    Patient Active Problem List   Diagnosis Date Noted   Hyperemesis affecting pregnancy, antepartum 10/26/2015   Twin gestation, dichorionic diamniotic 10/11/2015   H/O cesarean section complicating pregnancy  09/15/2015   High risk pregnancy due to history of preterm labor 09/15/2015   Allergic rhinitis 03/02/2014   Dermatitis, eczematoid 03/02/2014    Home Medications:    Current Meds  Medication Sig   ELDERBERRY PO Take by mouth daily.   FLUoxetine (PROZAC) 10 MG capsule Take 10 mg by mouth daily.   levonorgestrel (MIRENA) 20 MCG/24HR IUD 1 each by Intrauterine route once.   Multiple Vitamin (MULTI-VITAMIN) tablet Take 1 tablet by mouth daily.   Multiple Vitamins-Minerals (ZINC PO) Take by mouth daily.   VITAMIN D PO Take by mouth daily.    Allergies:   Azithromycin  Review of Systems (ROS): Review of Systems  Constitutional: Positive for activity change, appetite change, fatigue and fever.  HENT: Positive for congestion and sinus pressure.   Respiratory: Positive for cough, chest tightness and shortness of breath.   Cardiovascular: Negative for chest pain.  Gastrointestinal: Positive for diarrhea and nausea. Negative for vomiting.  Musculoskeletal: Positive for myalgias.     Vital Signs: Today's Vitals   08/07/20 1259 08/07/20 1306  BP: (!) 150/104 (!) 133/100  Pulse: (!) 103   Resp: 18   Temp: 98.4 F (36.9 C)   TempSrc: Oral   SpO2: 100%   Weight: 160 lb (72.6 kg)   Height: 5\' 6"  (1.676 m)   PainSc: 0-No pain     Physical Exam: Physical Exam Vitals and nursing note reviewed.  Constitutional:      General: She is not in acute distress.  Appearance: She is well-developed.  HENT:     Head: Normocephalic and atraumatic.     Right Ear: A middle ear effusion is present.     Left Ear: Tympanic membrane normal.  Eyes:     Conjunctiva/sclera: Conjunctivae normal.  Cardiovascular:     Rate and Rhythm: Normal rate and regular rhythm.     Heart sounds: No murmur heard.   Pulmonary:     Effort: Pulmonary effort is normal. No respiratory distress.     Breath sounds: Normal breath sounds.  Abdominal:     Palpations: Abdomen is soft.     Tenderness: There  is no abdominal tenderness.  Musculoskeletal:     Cervical back: Neck supple.  Skin:    General: Skin is warm and dry.  Neurological:     Mental Status: She is alert.      Urgent Care Treatments / Results:   LABS: PLEASE NOTE: all labs that were ordered this encounter are listed, however only abnormal results are displayed. Labs Reviewed  SARS CORONAVIRUS 2 (TAT 6-24 HRS)    EKG: -None  RADIOLOGY: No results found.  PROCEDURES: Procedures  MEDICATIONS RECEIVED THIS VISIT: Medications - No data to display  PERTINENT CLINICAL COURSE NOTES/UPDATES:   Initial Impression / Assessment and Plan / Urgent Care Course:  Pertinent labs & imaging results that were available during my care of the patient were personally reviewed by me and considered in my medical decision making (see lab/imaging section of note for values and interpretations).  Natalie Bennett is a 34 y.o. female who presents to [facility] today with complaints of loss of taste and smell, body aches, headaches and facial congestion, diagnosed with probable COVID-19, and treated as such with the directions below. NP and patient reviewed discharge instructions below during visit.   Patient is well appearing overall in clinic today. She does not appear to be in any acute distress. Presenting symptoms (see HPI) and exam as documented above.   I have reviewed the follow up and strict return precautions for any new or worsening symptoms. Patient is aware of symptoms that would be deemed urgent/emergent, and would thus require further evaluation either here or in the emergency department. At the time of discharge, she verbalized understanding and consent with the discharge plan as it was reviewed with her. All questions were fielded by provider and/or clinic staff prior to patient discharge.    Final Clinical Impressions / Urgent Care Diagnoses:   Final diagnoses:  COVID-19 virus infection    New  Prescriptions:  [place] Controlled Substance Registry consulted? Not Applicable  No orders of the defined types were placed in this encounter.     Discharge Instructions        Person Under Monitoring Name: Natalie Bennett  Location: [address] [place] [address]   Infection Prevention Recommendations for Individuals Confirmed to have, or Being Evaluated for, 2019 Novel Coronavirus (COVID-19) Infection Who Receive Care at Home  Individuals who are confirmed to have, or are being evaluated for, COVID-19 should follow the prevention steps below until a healthcare provider or local or state health department says they can return to normal activities.  Stay home except to get medical care You should restrict activities outside your home, except for getting medical care. Do not go to work, school, or public areas, and do not use public transportation or taxis.  Call ahead before visiting your doctor Before your medical appointment, call the healthcare provider and  tell them that you have, or are being evaluated for, COVID-19 infection. This will help the healthcare providers office take steps to keep other people from getting infected. Ask your healthcare provider to call the local or state health department.  Monitor your symptoms Seek prompt medical attention if your illness is worsening (e.g., difficulty breathing). Before going to your medical appointment, call the healthcare provider and tell them that you have, or are being evaluated for, COVID-19 infection. Ask your healthcare provider to call the local or state health department.  Wear a facemask You should wear a facemask that covers your nose and mouth when you are in the same room with other people and when you visit a healthcare provider. People who live with or visit you should also wear a facemask while they are in the same room with you.  Separate yourself from other people in your home As much as possible,  you should stay in a different room from other people in your home. Also, you should use a separate bathroom, if available.  Avoid sharing household items You should not share dishes, drinking glasses, cups, eating utensils, towels, bedding, or other items with other people in your home. After using these items, you should wash them thoroughly with soap and water.  Cover your coughs and sneezes Cover your mouth and nose with a tissue when you cough or sneeze, or you can cough or sneeze into your sleeve. Throw used tissues in a lined trash can, and immediately wash your hands with soap and water for at least 20 seconds or use an alcohol-based hand rub.  Wash your [employer] your hands often and thoroughly with soap and water for at least 20 seconds. You can use an alcohol-based hand sanitizer if soap and water are not available and if your hands are not visibly dirty. Avoid touching your eyes, nose, and mouth with unwashed hands.   Prevention Steps for Caregivers and Household Members of Individuals Confirmed to have, or Being Evaluated for, COVID-19 Infection Being Cared for in the Home  If you live with, or provide care at home for, a person confirmed to have, or being evaluated for, COVID-19 infection please follow these guidelines to prevent infection:  Follow healthcare providers instructions Make sure that you understand and can help the patient follow any healthcare provider instructions for all care.  Provide for the patients basic needs You should help the patient with basic needs in the home and provide support for getting groceries, prescriptions, and other personal needs.  Monitor the patients symptoms If they are getting sicker, call his or her medical provider and tell them that the patient has, or is being evaluated for, COVID-19 infection. This will help the healthcare providers office take steps to keep other people from getting infected. Ask the healthcare  provider to call the local or state health department.  Limit the number of people who have contact with the patient  If possible, have only one caregiver for the patient.  Other household members should stay in another home or place of residence. If this is not possible, they should stay  in another room, or be separated from the patient as much as possible. Use a separate bathroom, if available.  Restrict visitors who do not have an essential need to be in the home.  Keep older adults, very young children, and other sick people away from the patient Keep older adults, very young children, and those who have compromised immune systems or chronic  health conditions away from the patient. This includes people with chronic heart, lung, or kidney conditions, diabetes, and cancer.  Ensure good ventilation Make sure that shared spaces in the home have good air flow, such as from an air conditioner or an opened window, weather permitting.  Wash your hands often  Wash your hands often and thoroughly with soap and water for at least 20 seconds. You can use an alcohol based hand sanitizer if soap and water are not available and if your hands are not visibly dirty.  Avoid touching your eyes, nose, and mouth with unwashed hands.  Use disposable paper towels to dry your hands. If not available, use dedicated cloth towels and replace them when they become wet.  Wear a facemask and gloves  Wear a disposable facemask at all times in the room and gloves when you touch or have contact with the patients blood, body fluids, and/or secretions or excretions, such as sweat, saliva, sputum, nasal mucus, vomit, urine, or feces.  Ensure the mask fits over your nose and mouth tightly, and do not touch it during use.  Throw out disposable facemasks and gloves after using them. Do not reuse.  Wash your hands immediately after removing your facemask and gloves.  If your personal clothing becomes contaminated,  carefully remove clothing and launder. Wash your hands after handling contaminated clothing.  Place all used disposable facemasks, gloves, and other waste in a lined container before disposing them with other household waste.  Remove gloves and wash your hands immediately after handling these items.  Do not share dishes, glasses, or other household items with the patient  Avoid sharing household items. You should not share dishes, drinking glasses, cups, eating utensils, towels, bedding, or other items with a patient who is confirmed to have, or being evaluated for, COVID-19 infection.  After the person uses these items, you should wash them thoroughly with soap and water.  Wash laundry thoroughly  Immediately remove and wash clothes or bedding that have blood, body fluids, and/or secretions or excretions, such as sweat, saliva, sputum, nasal mucus, vomit, urine, or feces, on them.  Wear gloves when handling laundry from the patient.  Read and follow directions on labels of laundry or clothing items and detergent. In general, wash and dry with the warmest temperatures recommended on the label.  Clean all areas the individual has used often  Clean all touchable surfaces, such as counters, tabletops, doorknobs, bathroom fixtures, toilets, phones, keyboards, tablets, and bedside tables, every day. Also, clean any surfaces that may have blood, body fluids, and/or secretions or excretions on them.  Wear gloves when cleaning surfaces the patient has come in contact with.  Use a diluted bleach solution (e.g., dilute bleach with 1 part bleach and 10 parts water) or a household disinfectant with a label that says EPA-registered for coronaviruses. To make a bleach solution at home, add 1 tablespoon of bleach to 1 quart (4 cups) of water. For a larger supply, add  cup of bleach to 1 gallon (16 cups) of water.  Read labels of cleaning products and follow recommendations provided on product labels.  Labels contain instructions for safe and effective use of the cleaning product including precautions you should take when applying the product, such as wearing gloves or eye protection and making sure you have good ventilation during use of the product.  Remove gloves and wash hands immediately after cleaning.  Monitor yourself for signs and symptoms of illness Caregivers and household members are considered  close contacts, should monitor their health, and will be asked to limit movement outside of the home to the extent possible. Follow the monitoring steps for close contacts listed on the symptom monitoring form.   ? If you have additional questions, contact your local health department or call the epidemiologist on call at [phone] (available 24/7). ? This guidance is subject to change. For the most up-to-date guidance from [facility], please refer to their website: TripMetro.hu     Recommended Follow up Care:  Patient encouraged to follow up with the following provider within the specified time frame, or sooner as dictated by the severity of her symptoms. As always, she was instructed that for any urgent/emergent care needs, she should seek care either here or in the emergency department for more immediate evaluation.   Bailey Mech, DNP, NP-c    Bailey Mech, NP 08/07/20 1339

## 2020-08-07 NOTE — ED Triage Notes (Signed)
Patient in today c/o cough, congestion, loss of taste and smell x 4-5 days. Patient's husband tested positive for covid yesterday. Patient has not had the covid vaccines.

## 2020-08-07 NOTE — Discharge Instructions (Signed)
Person Under Monitoring Name: Natalie Bennett  Location: [address] [place] [address]   Infection Prevention Recommendations for Individuals Confirmed to have, or Being Evaluated for, 2019 Novel Coronavirus (COVID-19) Infection Who Receive Care at Home  Individuals who are confirmed to have, or are being evaluated for, COVID-19 should follow the prevention steps below until a healthcare provider or local or state health department says they can return to normal activities.  Stay home except to get medical care You should restrict activities outside your home, except for getting medical care. Do not go to work, school, or public areas, and do not use public transportation or taxis.  Call ahead before visiting your doctor Before your medical appointment, call the healthcare provider and tell them that you have, or are being evaluated for, COVID-19 infection. This will help the healthcare provider's office take steps to keep other people from getting infected. Ask your healthcare provider to call the local or state health department.  Monitor your symptoms Seek prompt medical attention if your illness is worsening (e.g., difficulty breathing). Before going to your medical appointment, call the healthcare provider and tell them that you have, or are being evaluated for, COVID-19 infection. Ask your healthcare provider to call the local or state health department.  Wear a facemask You should wear a facemask that covers your nose and mouth when you are in the same room with other people and when you visit a healthcare provider. People who live with or visit you should also wear a facemask while they are in the same room with you.  Separate yourself from other people in your home As much as possible, you should stay in a different room from other people in your home. Also, you should use a separate bathroom, if available.  Avoid sharing household items You should not  share dishes, drinking glasses, cups, eating utensils, towels, bedding, or other items with other people in your home. After using these items, you should wash them thoroughly with soap and water.  Cover your coughs and sneezes Cover your mouth and nose with a tissue when you cough or sneeze, or you can cough or sneeze into your sleeve. Throw used tissues in a lined trash can, and immediately wash your hands with soap and water for at least 20 seconds or use an alcohol-based hand rub.  Wash your [employer] your hands often and thoroughly with soap and water for at least 20 seconds. You can use an alcohol-based hand sanitizer if soap and water are not available and if your hands are not visibly dirty. Avoid touching your eyes, nose, and mouth with unwashed hands.   Prevention Steps for Caregivers and Household Members of Individuals Confirmed to have, or Being Evaluated for, COVID-19 Infection Being Cared for in the Home  If you live with, or provide care at home for, a person confirmed to have, or being evaluated for, COVID-19 infection please follow these guidelines to prevent infection:  Follow healthcare provider's instructions Make sure that you understand and can help the patient follow any healthcare provider instructions for all care.  Provide for the patient's basic needs You should help the patient with basic needs in the home and provide support for getting groceries, prescriptions, and other personal needs.  Monitor the patient's symptoms If they are getting sicker, call his or her medical provider and tell them that the patient has, or is being evaluated for, COVID-19 infection. This will help the healthcare provider's  office take steps to keep other people from getting infected. Ask the healthcare provider to call the local or state health department.  Limit the number of people who have contact with the patient If possible, have only one caregiver for the  patient. Other household members should stay in another home or place of residence. If this is not possible, they should stay in another room, or be separated from the patient as much as possible. Use a separate bathroom, if available. Restrict visitors who do not have an essential need to be in the home.  Keep older adults, very young children, and other sick people away from the patient Keep older adults, very young children, and those who have compromised immune systems or chronic health conditions away from the patient. This includes people with chronic heart, lung, or kidney conditions, diabetes, and cancer.  Ensure good ventilation Make sure that shared spaces in the home have good air flow, such as from an air conditioner or an opened window, weather permitting.  Wash your hands often Wash your hands often and thoroughly with soap and water for at least 20 seconds. You can use an alcohol based hand sanitizer if soap and water are not available and if your hands are not visibly dirty. Avoid touching your eyes, nose, and mouth with unwashed hands. Use disposable paper towels to dry your hands. If not available, use dedicated cloth towels and replace them when they become wet.  Wear a facemask and gloves Wear a disposable facemask at all times in the room and gloves when you touch or have contact with the patient's blood, body fluids, and/or secretions or excretions, such as sweat, saliva, sputum, nasal mucus, vomit, urine, or feces.  Ensure the mask fits over your nose and mouth tightly, and do not touch it during use. Throw out disposable facemasks and gloves after using them. Do not reuse. Wash your hands immediately after removing your facemask and gloves. If your personal clothing becomes contaminated, carefully remove clothing and launder. Wash your hands after handling contaminated clothing. Place all used disposable facemasks, gloves, and other waste in a lined container before  disposing them with other household waste. Remove gloves and wash your hands immediately after handling these items.  Do not share dishes, glasses, or other household items with the patient Avoid sharing household items. You should not share dishes, drinking glasses, cups, eating utensils, towels, bedding, or other items with a patient who is confirmed to have, or being evaluated for, COVID-19 infection. After the person uses these items, you should wash them thoroughly with soap and water.  Wash laundry thoroughly Immediately remove and wash clothes or bedding that have blood, body fluids, and/or secretions or excretions, such as sweat, saliva, sputum, nasal mucus, vomit, urine, or feces, on them. Wear gloves when handling laundry from the patient. Read and follow directions on labels of laundry or clothing items and detergent. In general, wash and dry with the warmest temperatures recommended on the label.  Clean all areas the individual has used often Clean all touchable surfaces, such as counters, tabletops, doorknobs, bathroom fixtures, toilets, phones, keyboards, tablets, and bedside tables, every day. Also, clean any surfaces that may have blood, body fluids, and/or secretions or excretions on them. Wear gloves when cleaning surfaces the patient has come in contact with. Use a diluted bleach solution (e.g., dilute bleach with 1 part bleach and 10 parts water) or a household disinfectant with a label that says EPA-registered for coronaviruses. To make a  bleach solution at home, add 1 tablespoon of bleach to 1 quart (4 cups) of water. For a larger supply, add  cup of bleach to 1 gallon (16 cups) of water. Read labels of cleaning products and follow recommendations provided on product labels. Labels contain instructions for safe and effective use of the cleaning product including precautions you should take when applying the product, such as wearing gloves or eye protection and making sure you  have good ventilation during use of the product. Remove gloves and wash hands immediately after cleaning.  Monitor yourself for signs and symptoms of illness Caregivers and household members are considered close contacts, should monitor their health, and will be asked to limit movement outside of the home to the extent possible. Follow the monitoring steps for close contacts listed on the symptom monitoring form.   ? If you have additional questions, contact your local health department or call the epidemiologist on call at [phone] (available 24/7). ? This guidance is subject to change. For the most up-to-date guidance from [facility], please refer to their website: YouBlogs.pl

## 2020-08-08 LAB — SARS CORONAVIRUS 2 (TAT 6-24 HRS): SARS Coronavirus 2: POSITIVE — AB

## 2020-08-09 ENCOUNTER — Encounter: Payer: Self-pay | Admitting: Nurse Practitioner

## 2020-08-09 ENCOUNTER — Other Ambulatory Visit: Payer: Self-pay | Admitting: Nurse Practitioner

## 2020-08-09 ENCOUNTER — Telehealth: Payer: Self-pay | Admitting: Nurse Practitioner

## 2020-08-09 DIAGNOSIS — E663 Overweight: Secondary | ICD-10-CM | POA: Insufficient documentation

## 2020-08-09 DIAGNOSIS — U071 COVID-19: Secondary | ICD-10-CM

## 2020-08-09 NOTE — Telephone Encounter (Signed)
I connected by phone with Natalie Bennett on 08/09/2020 at 4:37 PM to discuss the potential use of a new treatment for mild to moderate COVID-19 viral infection in non-hospitalized patients.  This patient is a 34 y.o. female that meets the FDA criteria for Emergency Use Authorization of COVID monoclonal antibody casirivimab/imdevimab.  Has a (+) direct SARS-CoV-2 viral test result  Has mild or moderate COVID-19   Is NOT hospitalized due to COVID-19  Is within 10 days of symptom onset  Has at least one of the high risk factor(s) for progression to severe COVID-19 and/or hospitalization as defined in EUA.  Specific high risk criteria : BMI > 25   I have spoken and communicated the following to the patient or parent/caregiver regarding COVID monoclonal antibody treatment:  1. FDA has authorized the emergency use for the treatment of mild to moderate COVID-19 in adults and pediatric patients with positive results of direct SARS-CoV-2 viral testing who are 105 years of age and older weighing at least 40 kg, and who are at high risk for progressing to severe COVID-19 and/or hospitalization.  2. The significant known and potential risks and benefits of COVID monoclonal antibody, and the extent to which such potential risks and benefits are unknown.  3. Information on available alternative treatments and the risks and benefits of those alternatives, including clinical trials.  4. Patients treated with COVID monoclonal antibody should continue to self-isolate and use infection control measures (e.g., wear mask, isolate, social distance, avoid sharing personal items, clean and disinfect "high touch" surfaces, and frequent handwashing) according to CDC guidelines.   5. The patient or parent/caregiver has the option to accept or refuse COVID monoclonal antibody treatment.  After reviewing this information with the patient, The patient agreed to proceed with receiving casirivimab\imdevimab  infusion and will be provided a copy of the Fact sheet prior to receiving the infusion.  Nicolasa Ducking, NP 08/09/2020 4:37 PM

## 2020-08-09 NOTE — Progress Notes (Signed)
I connected by phone with Natalie Bennett on 08/09/2020 at 4:39 PM to discuss the potential use of a new treatment for mild to moderate COVID-19 viral infection in non-hospitalized patients.  This patient is a 34 y.o. female that meets the FDA criteria for Emergency Use Authorization of COVID monoclonal antibody casirivimab/imdevimab.  Has a (+) direct SARS-CoV-2 viral test result  Has mild or moderate COVID-19   Is NOT hospitalized due to COVID-19  Is within 10 days of symptom onset  Has at least one of the high risk factor(s) for progression to severe COVID-19 and/or hospitalization as defined in EUA.  Specific high risk criteria : BMI > 25   I have spoken and communicated the following to the patient or parent/caregiver regarding COVID monoclonal antibody treatment:  1. FDA has authorized the emergency use for the treatment of mild to moderate COVID-19 in adults and pediatric patients with positive results of direct SARS-CoV-2 viral testing who are 75 years of age and older weighing at least 40 kg, and who are at high risk for progressing to severe COVID-19 and/or hospitalization.  2. The significant known and potential risks and benefits of COVID monoclonal antibody, and the extent to which such potential risks and benefits are unknown.  3. Information on available alternative treatments and the risks and benefits of those alternatives, including clinical trials.  4. Patients treated with COVID monoclonal antibody should continue to self-isolate and use infection control measures (e.g., wear mask, isolate, social distance, avoid sharing personal items, clean and disinfect "high touch" surfaces, and frequent handwashing) according to CDC guidelines.   5. The patient or parent/caregiver has the option to accept or refuse COVID monoclonal antibody treatment.  After reviewing this information with the patient, The patient agreed to proceed with receiving casirivimab\imdevimab  infusion and will be provided a copy of the Fact sheet prior to receiving the infusion.  Nicolasa Ducking, NP 08/09/2020 4:39 PM

## 2020-08-10 ENCOUNTER — Ambulatory Visit (HOSPITAL_COMMUNITY)
Admission: RE | Admit: 2020-08-10 | Discharge: 2020-08-10 | Disposition: A | Discharge status: Home / Self Care | Payer: BC Managed Care – PPO | Source: Ambulatory Visit | Attending: Pulmonary Disease | Admitting: Pulmonary Disease

## 2020-08-10 DIAGNOSIS — U071 COVID-19: Secondary | ICD-10-CM | POA: Diagnosis not present

## 2020-08-10 DIAGNOSIS — E663 Overweight: Secondary | ICD-10-CM | POA: Diagnosis present

## 2020-08-10 MED ORDER — SODIUM CHLORIDE 0.9 % IV SOLN: INTRAVENOUS | Status: DC | PRN

## 2020-08-10 MED ORDER — ALBUTEROL SULFATE HFA 108 (90 BASE) MCG/ACT IN AERS: 2.0000 | INHALATION_SPRAY | Freq: Once | RESPIRATORY_TRACT | Status: DC | PRN

## 2020-08-10 MED ORDER — DIPHENHYDRAMINE HCL 50 MG/ML IJ SOLN: 50.0000 mg | Freq: Once | INTRAMUSCULAR | Status: DC | PRN

## 2020-08-10 MED ORDER — METHYLPREDNISOLONE SODIUM SUCC 125 MG IJ SOLR: 125.0000 mg | Freq: Once | INTRAMUSCULAR | Status: DC | PRN

## 2020-08-10 MED ORDER — ACETAMINOPHEN 325 MG PO TABS
650.0000 mg | ORAL_TABLET | Freq: Four times a day (QID) | ORAL | Status: DC | PRN
  Administered 2020-08-10: 650 mg via ORAL
  Filled 2020-08-10: qty 2

## 2020-08-10 MED ORDER — SODIUM CHLORIDE 0.9 % IV SOLN
1200.0000 mg | Freq: Once | INTRAVENOUS | Status: AC
  Administered 2020-08-10: 1200 mg via INTRAVENOUS

## 2020-08-10 MED ORDER — FAMOTIDINE IN NACL 20-0.9 MG/50ML-% IV SOLN: 20.0000 mg | Freq: Once | INTRAVENOUS | Status: DC | PRN

## 2020-08-10 MED ORDER — EPINEPHRINE 0.3 MG/0.3ML IJ SOAJ: 0.3000 mg | Freq: Once | INTRAMUSCULAR | Status: DC | PRN

## 2020-08-10 NOTE — Discharge Instructions (Signed)

## 2020-08-10 NOTE — Progress Notes (Signed)
  Diagnosis: COVID-19  Physician:  Procedure: [facility] Med: casirivimab\imdevimab infusion - Provided patient with casirivimab\imdevimab fact sheet for patients, parents and caregivers prior to infusion.  Complications: No immediate complications noted.  Discharge: Discharged home   Wendie Simmer 08/10/2020

## 2024-02-28 ENCOUNTER — Other Ambulatory Visit: Payer: Self-pay | Admitting: Emergency Medicine

## 2024-02-28 DIAGNOSIS — R102 Pelvic and perineal pain: Secondary | ICD-10-CM

## 2024-03-05 ENCOUNTER — Ambulatory Visit
Admission: RE | Admit: 2024-03-05 | Discharge: 2024-03-05 | Disposition: A | Discharge status: Home / Self Care | Source: Ambulatory Visit | Attending: Emergency Medicine | Admitting: Emergency Medicine

## 2024-03-05 DIAGNOSIS — R102 Pelvic and perineal pain: Secondary | ICD-10-CM
